# Patient Record
Sex: Female | Born: 1975 | Race: Black or African American | Hispanic: No | Marital: Single | Smoking: Never smoker
Health system: Southern US, Community
[De-identification: ages and names within clinical notes are randomized; demographics above are authoritative.]

## PROBLEM LIST (undated history)

## (undated) DIAGNOSIS — U071 COVID-19: Secondary | ICD-10-CM

## (undated) DIAGNOSIS — I10 Essential (primary) hypertension: Secondary | ICD-10-CM

## (undated) DIAGNOSIS — G43701 Chronic migraine without aura, not intractable, with status migrainosus: Secondary | ICD-10-CM

## (undated) DIAGNOSIS — J45909 Unspecified asthma, uncomplicated: Secondary | ICD-10-CM

## (undated) DIAGNOSIS — K219 Gastro-esophageal reflux disease without esophagitis: Secondary | ICD-10-CM

---

## 2000-11-12 ENCOUNTER — Encounter: Admission: RE | Admit: 2000-11-12 | Discharge: 2000-11-12 | Payer: Self-pay | Admitting: Family Medicine

## 2000-11-30 ENCOUNTER — Encounter: Admission: RE | Admit: 2000-11-30 | Discharge: 2000-11-30 | Payer: Self-pay | Admitting: Family Medicine

## 2000-12-01 ENCOUNTER — Other Ambulatory Visit: Admission: RE | Admit: 2000-12-01 | Discharge: 2000-12-01 | Payer: Self-pay | Admitting: Obstetrics

## 2001-05-20 ENCOUNTER — Encounter: Admission: RE | Admit: 2001-05-20 | Discharge: 2001-05-20 | Payer: Self-pay | Admitting: Family Medicine

## 2001-06-15 ENCOUNTER — Encounter: Admission: RE | Admit: 2001-06-15 | Discharge: 2001-06-15 | Payer: Self-pay | Admitting: Family Medicine

## 2001-07-16 ENCOUNTER — Encounter: Admission: RE | Admit: 2001-07-16 | Discharge: 2001-07-16 | Payer: Self-pay | Admitting: Family Medicine

## 2001-07-23 ENCOUNTER — Ambulatory Visit (HOSPITAL_COMMUNITY): Admission: RE | Admit: 2001-07-23 | Discharge: 2001-07-23 | Payer: Self-pay | Admitting: Sports Medicine

## 2001-07-23 ENCOUNTER — Encounter: Admission: RE | Admit: 2001-07-23 | Discharge: 2001-07-23 | Payer: Self-pay | Admitting: Family Medicine

## 2001-07-23 ENCOUNTER — Ambulatory Visit (HOSPITAL_COMMUNITY): Admission: RE | Admit: 2001-07-23 | Discharge: 2001-07-23 | Payer: Self-pay | Admitting: Family Medicine

## 2001-08-11 ENCOUNTER — Encounter: Admission: RE | Admit: 2001-08-11 | Discharge: 2001-08-11 | Payer: Self-pay | Admitting: Family Medicine

## 2001-09-02 ENCOUNTER — Encounter: Admission: RE | Admit: 2001-09-02 | Discharge: 2001-09-02 | Payer: Self-pay | Admitting: Family Medicine

## 2001-09-03 ENCOUNTER — Encounter: Admission: RE | Admit: 2001-09-03 | Discharge: 2001-09-03 | Payer: Self-pay | Admitting: Family Medicine

## 2001-09-16 ENCOUNTER — Encounter: Admission: RE | Admit: 2001-09-16 | Discharge: 2001-09-16 | Payer: Self-pay | Admitting: Family Medicine

## 2001-10-05 ENCOUNTER — Encounter: Admission: RE | Admit: 2001-10-05 | Discharge: 2001-10-05 | Payer: Self-pay | Admitting: Family Medicine

## 2002-01-14 ENCOUNTER — Encounter: Admission: RE | Admit: 2002-01-14 | Discharge: 2002-01-14 | Payer: Self-pay | Admitting: Family Medicine

## 2002-02-04 ENCOUNTER — Encounter: Admission: RE | Admit: 2002-02-04 | Discharge: 2002-02-04 | Payer: Self-pay | Admitting: Family Medicine

## 2002-03-25 ENCOUNTER — Encounter: Admission: RE | Admit: 2002-03-25 | Discharge: 2002-03-25 | Payer: Self-pay | Admitting: Family Medicine

## 2002-05-02 ENCOUNTER — Encounter: Admission: RE | Admit: 2002-05-02 | Discharge: 2002-05-02 | Payer: Self-pay | Admitting: Family Medicine

## 2002-07-28 ENCOUNTER — Encounter: Admission: RE | Admit: 2002-07-28 | Discharge: 2002-07-28 | Payer: Self-pay | Admitting: Family Medicine

## 2002-09-22 ENCOUNTER — Encounter: Admission: RE | Admit: 2002-09-22 | Discharge: 2002-09-22 | Payer: Self-pay | Admitting: Family Medicine

## 2002-09-29 ENCOUNTER — Encounter: Admission: RE | Admit: 2002-09-29 | Discharge: 2002-09-29 | Payer: Self-pay | Admitting: Family Medicine

## 2002-10-25 ENCOUNTER — Encounter: Admission: RE | Admit: 2002-10-25 | Discharge: 2002-10-25 | Payer: Self-pay | Admitting: Family Medicine

## 2003-01-23 ENCOUNTER — Encounter: Admission: RE | Admit: 2003-01-23 | Discharge: 2003-01-23 | Payer: Self-pay | Admitting: Family Medicine

## 2003-03-03 ENCOUNTER — Encounter: Payer: Self-pay | Admitting: Internal Medicine

## 2003-03-03 ENCOUNTER — Ambulatory Visit (HOSPITAL_COMMUNITY): Admission: RE | Admit: 2003-03-03 | Discharge: 2003-03-03 | Payer: Self-pay | Admitting: Internal Medicine

## 2005-11-18 ENCOUNTER — Encounter: Admission: RE | Admit: 2005-11-18 | Discharge: 2005-11-18 | Payer: Self-pay | Admitting: Internal Medicine

## 2007-01-19 ENCOUNTER — Emergency Department (HOSPITAL_COMMUNITY): Admission: EM | Admit: 2007-01-19 | Discharge: 2007-01-19 | Payer: Self-pay | Admitting: Family Medicine

## 2007-01-26 ENCOUNTER — Encounter: Admission: RE | Admit: 2007-01-26 | Discharge: 2007-01-26 | Payer: Self-pay | Admitting: Internal Medicine

## 2008-07-24 ENCOUNTER — Emergency Department (HOSPITAL_COMMUNITY): Admission: EM | Admit: 2008-07-24 | Discharge: 2008-07-24 | Payer: Self-pay | Admitting: Emergency Medicine

## 2009-03-08 ENCOUNTER — Emergency Department (HOSPITAL_COMMUNITY): Admission: EM | Admit: 2009-03-08 | Discharge: 2009-03-09 | Payer: Self-pay | Admitting: Emergency Medicine

## 2010-08-16 ENCOUNTER — Emergency Department (HOSPITAL_COMMUNITY): Admission: EM | Admit: 2010-08-16 | Discharge: 2010-08-16 | Payer: Self-pay | Admitting: Emergency Medicine

## 2010-11-15 ENCOUNTER — Ambulatory Visit: Payer: Self-pay | Admitting: Pulmonary Disease

## 2010-11-15 ENCOUNTER — Encounter: Payer: Self-pay | Admitting: Pulmonary Disease

## 2010-11-15 DIAGNOSIS — J309 Allergic rhinitis, unspecified: Secondary | ICD-10-CM | POA: Insufficient documentation

## 2010-11-15 DIAGNOSIS — J45909 Unspecified asthma, uncomplicated: Secondary | ICD-10-CM | POA: Insufficient documentation

## 2011-01-16 NOTE — Assessment & Plan Note (Signed)
Summary: COUGH/WHITE DROPLETS-OFF/ON-KP   Visit Type:  Initial Consult Copy to:  self Primary Provider/Referring Provider:  Andi Devon, MD  CC:  Cough.  History of Present Illness: 34/F for evaluation of asthma  c/o expectoration of foul smelling particles x 11 mnths, like 'cooked rice',  mar'10 cxr rul 2.5 cm  nodular opacity, but CXR  Asthma x high school, gained 100 lbs since, last ER visit mar'10, UC visits  -once this yr, triggers -URIs, stress, skin allergies to dog - gave it away, never tested formally symbicort has worked well -ventolin -once/wk or less, lasts a year winded climbing stairs, walking in the parking lot,  Spirometry nml.  Preventive Screening-Counseling & Management  Alcohol-Tobacco     Smoking Status: quit     Year Started: 1994     Year Quit: 2000  Current Medications (verified): 1)  Ventolin Hfa 108 (90 Base) Mcg/act Aers (Albuterol Sulfate) .... As Needed 2)  Albuterol Sulfate (2.5 Mg/76ml) 0.083% Nebu (Albuterol Sulfate) .... As Needed 3)  Symbicort 80-4.5 Mcg/act Aero (Budesonide-Formoterol Fumarate) .... Inhale 1 Puff Two Times A Day 4)  Xanax 0.25 Mg Tabs (Alprazolam) .... Take 1 Tab By Mouth At Bedtime As Needed For Sleep 5)  Benadryl 25 Mg Caps (Diphenhydramine Hcl) .... As Needed 6)  Zyrtec Hives Relief 10 Mg Tabs (Cetirizine Hcl) .... Take 1 Tablet By Mouth Once A Day 7)  Zantac 75 75 Mg Tabs (Ranitidine Hcl) .... Take 1 Tablet By Mouth Once A Day 8)  Cleocin-T 1 % Lotn (Clindamycin Phosphate) .... Two Times A Day 9)  Hydroquinone 4 % Crea (Hydroquinone) .... Two Times A Day 10)  Triamcinolone Acetonide 0.1 % Crea (Triamcinolone Acetonide) .... Nightly 11)  Ted Hose  Allergies (verified): 1)  ! * Birth Control 2)  ! Codeine 3)  ! Percocet 4)  ! Darvocet 5)  ! Vicodin 6)  ! Hydrocodone 7)  ! Ultram 8)  ! Flexeril 9)  ! Effexor  Past History:  Past Medical History: Angina Allergic Rhinitis Asthma Headaches  Past Surgical  History: none  Family History: Reviewed history and no changes required. Family History Asthma-mother Heart disease-paternal grandfather Colon Cancer- Family History Breast Cancer Cervical cancer Family History Lung Cancer-maternal great-grandfather  Social History: Reviewed history and no changes required. Marital Status: Single Children: no Occupation:RN  Patient states former smoker.  Smoking Status:  quit  Review of Systems       The patient complains of shortness of breath with activity, productive cough, non-productive cough, chest pain, irregular heartbeats, acid heartburn, indigestion, difficulty swallowing, sore throat, tooth/dental problems, headaches, nasal congestion/difficulty breathing through nose, sneezing, itching, hand/feet swelling, joint stiffness or pain, and change in color of mucus.  The patient denies shortness of breath at rest, coughing up blood, loss of appetite, weight change, abdominal pain, ear ache, anxiety, depression, rash, and fever.    Vital Signs:  Patient profile:   35 year old female Height:      63.5 inches Weight:      220.8 pounds BMI:     38.64 O2 Sat:      100 % on Room air Temp:     98.2 degrees F oral Pulse rate:   80 / minute BP sitting:   120 / 80  (left arm) Cuff size:   large  Vitals Entered By: Zackery Barefoot CMA (November 15, 2010 2:11 PM)  O2 Flow:  Room air CC: Cough Comments Medications reviewed with patient Verified contact number and pharmacy  with patient Zackery Barefoot CMA  November 15, 2010 2:12 PM    Physical Exam  Additional Exam:  Gen. Pleasant, well-nourished, in no distress, normal affect ENT - no lesions, no post nasal drip Neck: No JVD, no thyromegaly, no carotid bruits Lungs: no use of accessory muscles, no dullness to percussion, clear without rales or rhonchi  Cardiovascular: Rhythm regular, heart sounds  normal, no murmurs or gallops, no peripheral edema Abdomen: soft and non-tender, no  hepatosplenomegaly, BS normal. Musculoskeletal: No deformities, no cyanosis or clubbing Neuro:  alert, non focal     CXR  Procedure date:  11/15/2010  Findings:      IMPRESSION: No active cardiopulmonary disease.  Impression & Recommendations:  Problem # 1:  ASTHMA (ICD-493.90) Appears mild persistent by history - stay on symbicort Unclear what is the cause of her present symptoms (expectorating fine particles , even when not having an asthma attack) RUL opacity noted in march '10 has resolved, likley atelectasis.  Medications Added to Medication List This Visit: 1)  Ventolin Hfa 108 (90 Base) Mcg/act Aers (Albuterol sulfate) .... As needed 2)  Albuterol Sulfate (2.5 Mg/5ml) 0.083% Nebu (Albuterol sulfate) .... As needed 3)  Symbicort 80-4.5 Mcg/act Aero (Budesonide-formoterol fumarate) .... Inhale 1 puff two times a day 4)  Xanax 0.25 Mg Tabs (Alprazolam) .... Take 1 tab by mouth at bedtime as needed for sleep 5)  Benadryl 25 Mg Caps (Diphenhydramine hcl) .... As needed 6)  Zyrtec Hives Relief 10 Mg Tabs (Cetirizine hcl) .... Take 1 tablet by mouth once a day 7)  Zantac 75 75 Mg Tabs (Ranitidine hcl) .... Take 1 tablet by mouth once a day 8)  Cleocin-t 1 % Lotn (Clindamycin phosphate) .... Two times a day 9)  Hydroquinone 4 % Crea (Hydroquinone) .... Two times a day 10)  Triamcinolone Acetonide 0.1 % Crea (Triamcinolone acetonide) .... Nightly 11)  Ted Hose   Other Orders: T-2 View CXR (71020TC) Consultation Level III (16109) Spirometry w/Graph (60454)  Patient Instructions: 1)  Copy sent to: Kim shelton 2)  Breathing test - no obstruction 3)  A chest x-ray is normal  4)  Stay on symbicort  5)  Call if worse

## 2011-03-27 LAB — POCT PREGNANCY, URINE: Preg Test, Ur: NEGATIVE

## 2011-06-12 ENCOUNTER — Other Ambulatory Visit: Payer: Self-pay | Admitting: Internal Medicine

## 2011-06-12 DIAGNOSIS — N644 Mastodynia: Secondary | ICD-10-CM

## 2011-06-20 ENCOUNTER — Ambulatory Visit
Admission: RE | Admit: 2011-06-20 | Discharge: 2011-06-20 | Disposition: A | Discharge status: Home / Self Care | Payer: BC Managed Care – PPO | Source: Ambulatory Visit | Attending: Internal Medicine | Admitting: Internal Medicine

## 2011-06-20 DIAGNOSIS — N644 Mastodynia: Secondary | ICD-10-CM

## 2011-09-12 LAB — URINALYSIS, ROUTINE W REFLEX MICROSCOPIC
Bilirubin Urine: NEGATIVE
Glucose, UA: NEGATIVE
Hgb urine dipstick: NEGATIVE
Ketones, ur: NEGATIVE
Nitrite: NEGATIVE
Protein, ur: NEGATIVE
Specific Gravity, Urine: 1.015
Urobilinogen, UA: 0.2
pH: 6

## 2011-09-12 LAB — POCT CARDIAC MARKERS
CKMB, poc: <1 — ABNORMAL LOW
Myoglobin, poc: 92.8
Troponin i, poc: 0.06 — ABNORMAL HIGH

## 2011-09-12 LAB — CBC
HCT: 39.2
Hemoglobin: 13.4
MCHC: 34
MCV: 80.2
Platelets: 214
RBC: 4.89
RDW: 12.8
WBC: 5.8

## 2011-09-12 LAB — URINE MICROSCOPIC-ADD ON

## 2011-09-12 LAB — CK TOTAL AND CKMB (NOT AT ARMC)
Relative Index: INVALID
Total CK: 93

## 2011-09-12 LAB — DIFFERENTIAL
Basophils Absolute: 0
Basophils Relative: 0
Eosinophils Absolute: 0
Eosinophils Relative: 1
Lymphocytes Relative: 36
Lymphs Abs: 2.1
Monocytes Absolute: 0.2
Monocytes Relative: 3
Neutro Abs: 3.5
Neutrophils Relative %: 60

## 2011-09-12 LAB — POCT PREGNANCY, URINE: Preg Test, Ur: NEGATIVE

## 2011-09-12 LAB — D-DIMER, QUANTITATIVE (NOT AT ARMC): D-Dimer, Quant: 0.39

## 2016-08-30 ENCOUNTER — Emergency Department (HOSPITAL_COMMUNITY)
Admission: EM | Admit: 2016-08-30 | Discharge: 2016-08-30 | Disposition: A | Discharge status: Home / Self Care | Payer: Managed Care, Other (non HMO) | Attending: Emergency Medicine | Admitting: Emergency Medicine

## 2016-08-30 ENCOUNTER — Encounter (HOSPITAL_COMMUNITY): Payer: Self-pay

## 2016-08-30 ENCOUNTER — Emergency Department (HOSPITAL_COMMUNITY): Payer: Managed Care, Other (non HMO)

## 2016-08-30 DIAGNOSIS — Y999 Unspecified external cause status: Secondary | ICD-10-CM | POA: Diagnosis not present

## 2016-08-30 DIAGNOSIS — S4992XA Unspecified injury of left shoulder and upper arm, initial encounter: Secondary | ICD-10-CM | POA: Diagnosis present

## 2016-08-30 DIAGNOSIS — S52042A Displaced fracture of coronoid process of left ulna, initial encounter for closed fracture: Secondary | ICD-10-CM

## 2016-08-30 DIAGNOSIS — W0110XA Fall on same level from slipping, tripping and stumbling with subsequent striking against unspecified object, initial encounter: Secondary | ICD-10-CM | POA: Diagnosis not present

## 2016-08-30 DIAGNOSIS — Y929 Unspecified place or not applicable: Secondary | ICD-10-CM | POA: Insufficient documentation

## 2016-08-30 DIAGNOSIS — Y939 Activity, unspecified: Secondary | ICD-10-CM | POA: Diagnosis not present

## 2016-08-30 DIAGNOSIS — S52122A Displaced fracture of head of left radius, initial encounter for closed fracture: Secondary | ICD-10-CM

## 2016-08-30 DIAGNOSIS — J45909 Unspecified asthma, uncomplicated: Secondary | ICD-10-CM | POA: Diagnosis not present

## 2016-08-30 DIAGNOSIS — S52045A Nondisplaced fracture of coronoid process of left ulna, initial encounter for closed fracture: Secondary | ICD-10-CM | POA: Insufficient documentation

## 2016-08-30 HISTORY — DX: Unspecified asthma, uncomplicated: J45.909

## 2016-08-30 MED ORDER — IBUPROFEN 800 MG PO TABS
800.0000 mg | ORAL_TABLET | Freq: Once | ORAL | Status: AC
  Administered 2016-08-30: 800 mg via ORAL
  Filled 2016-08-30: qty 1

## 2016-08-30 NOTE — ED Notes (Signed)
Ortho Tech at the bedside ° °

## 2016-08-30 NOTE — Discharge Instructions (Signed)
You have fractured the bones in your elbow.  We will place an immobilizing device.  Elevate, ice, Tylenol or ibuprofen for pain. Follow-up with orthopedic doctor. Phone number given.

## 2016-08-30 NOTE — ED Notes (Signed)
Going to xray 

## 2016-08-30 NOTE — ED Provider Notes (Signed)
[facility]-EMERGENCY DEPT Provider Note   CSN: [id number] Arrival date & time: 08/30/16  1505     History   Chief Complaint Chief Complaint  Patient presents with  . Shoulder Injury    HPI Danielle May is a 40 y.o. female.  Accidental trip and fall earlier today striking left elbow.   No head or neck trauma. Patient was seen at the local urgent care center and referred to the ED. Patient was told that she had a dislocated shoulder, but her shoulder is not hurting. Severity of pain is moderate. Pain is worse with range of motion at the elbow      Past Medical History:  Diagnosis Date  . Asthma     Patient Active Problem List   Diagnosis Date Noted  . ALLERGIC RHINITIS 11/15/2010  . ASTHMA 11/15/2010    No past surgical history on file.  OB History    No data available       Home Medications    Prior to Admission medications   Medication Sig Start Date End Date Taking? Authorizing Provider  albuterol (PROVENTIL) (2.5 MG/3ML) 0.083% nebulizer solution Inhale 2.5 mg into the lungs every 6 (six) hours as needed for wheezing or shortness of breath.    Yes Historical Provider, MD  Albuterol Sulfate 108 (90 Base) MCG/ACT AEPB Inhale 2 puffs into the lungs every 6 (six) hours as needed (for wheezing or shortness of breath).    Yes Historical Provider, MD  Artificial Tear Solution (GENTEAL TEARS OP) Apply 1 drop to eye daily as needed (dry eye relief).   Yes Historical Provider, MD  cetirizine (ZYRTEC) 10 MG tablet Take 10 mg by mouth daily.   Yes Historical Provider, MD  lansoprazole (PREVACID) 30 MG capsule Take 30 mg by mouth daily at 12 noon.   Yes Historical Provider, MD  pantoprazole (PROTONIX) 40 MG tablet Take 40 mg by mouth daily.   Yes Historical Provider, MD    Family History No family history on file.  Social History Social History  Substance Use Topics  . Smoking status: Never Smoker  . Smokeless tobacco: Never Used  . Alcohol use No     Allergies    Cyclobenzaprine hcl; Ipratropium; Venlafaxine; Codeine; Hydrocodone; Hydrocodone-acetaminophen; Ortho tri-cyclen  [norgestimate-eth estradiol]; Oxycodone-acetaminophen; Propoxyphene n-acetaminophen; and Tramadol hcl   Review of Systems Review of Systems  All other systems reviewed and are negative.    Physical Exam Updated Vital Signs BP 148/100 (BP Location: Right Wrist)   Pulse 82   Temp 98 F (36.7 C) (Oral)   Resp 16   Ht 5\' 3"  (1.6 m)   Wt 254 lb (115.2 kg)   LMP 08/26/2016 (Exact Date)   SpO2 100%   BMI 44.99 kg/m   Physical Exam  Constitutional: She is oriented to person, place, and time. She appears well-developed and well-nourished.  HENT:  Head: Normocephalic and atraumatic.  Eyes: Conjunctivae are normal.  Neck: Neck supple.  Cardiovascular: Normal rate and regular rhythm.   Pulmonary/Chest: Effort normal and breath sounds normal.  Abdominal: Soft. Bowel sounds are normal.  Musculoskeletal:  Left upper extremity: Tender over elbow tip. Pain with flexion and extension and pain with internal and external rotation  Neurological: She is alert and oriented to person, place, and time.  Skin: Skin is warm and dry.  Psychiatric: She has a normal mood and affect. Her behavior is normal.  Nursing note and vitals reviewed.    ED Treatments / Results  Labs (all labs  ordered are listed, but only abnormal results are displayed) Labs Reviewed - No data to display  EKG  EKG Interpretation None       Radiology Dg Elbow Complete Left  Result Date: 08/30/2016 CLINICAL DATA:  Pain in LEFT elbow and forearm, fell today EXAM: LEFT ELBOW - COMPLETE 3+ VIEW COMPARISON:  None FINDINGS: Osseous mineralization normal. Joint spaces preserved. Minimally displaced intra-articular LEFT radial head fracture. Associated elbow joint effusion. Nondisplaced coronoid process fracture of ulna. No additional fracture, dislocation, or bone destruction. IMPRESSION: Mildly displaced  intra-articular LEFT radial head fracture. Nondisplaced coronoid process fracture LEFT ulna. Electronically Signed   By: Ulyses Southward M.D.   On: 08/30/2016 18:53   Dg Forearm Left  Result Date: 08/30/2016 CLINICAL DATA:  Fall EXAM: LEFT FOREARM - 2 VIEW COMPARISON:  LEFT elbow radiographs 08/30/2016 FINDINGS: Displaced intra-articular LEFT radial head fracture again seen. Wrist and elbow joint alignments normal. Ulnar coronoid process fracture seen on prior elbow radiographs is not definitely visualized. No additional fracture, dislocation, or bone destruction. IMPRESSION: Displaced intra-articular fracture LEFT radial head. No additional forearm abnormalities identified. Electronically Signed   By: Ulyses Southward M.D.   On: 08/30/2016 18:55    Procedures Procedures (including critical care time)  Medications Ordered in ED Medications  ibuprofen (ADVIL,MOTRIN) tablet 800 mg (800 mg Oral Given 08/30/16 2026)     Initial Impression / Assessment and Plan / ED Course  I have reviewed the triage vital signs and the nursing notes.  Pertinent labs & imaging results that were available during my care of the patient were reviewed by me and considered in my medical decision making (see chart for details).  Clinical Course    Patient has a radial head fracture extending into the joint space and a fracture of the coronoid process of the ulna, both minimally displaced. Discussed with orthopedic surgeon Dr. Janee Morn. Will splint the elbow, pain management, referral to orthopedics.  Final Clinical Impressions(s) / ED Diagnoses   Final diagnoses:  Radial head fracture, closed, left, initial encounter  Fracture of ulna, coronoid process, left, closed, initial encounter    New Prescriptions New Prescriptions   No medications on file     Donnetta Hutching, MD 08/30/16 2133

## 2016-08-30 NOTE — ED Notes (Signed)
PT CAME FROM URGENT CARE AFTER SHE FELL AT HOME INJURING HER LT ELBOW AND LOWER ARM   GOOD RADIAL; PULSE

## 2016-08-30 NOTE — ED Triage Notes (Signed)
Pt was sent here from UC. She reports she fell this morning and was told by the MD that she has shoulder dislocation. Pulses inatct, limited movement noted.

## 2016-08-30 NOTE — ED Notes (Signed)
NO LOC  ALERT ORIENTED

## 2016-08-30 NOTE — Progress Notes (Signed)
Orthopedic Tech Progress Note Patient Details:  Izola Priceameco L Kotch 07/20/1976 [id number]  Ortho Devices Type of Ortho Device: Ace wrap, Arm sling Ortho Device/Splint Location: LUE Ortho Device/Splint Interventions: Ordered, Application   Jennye MoccasinHughes, Feliciana Narayan Craig 08/30/2016, 9:56 PM

## 2016-08-30 NOTE — ED Notes (Signed)
Reports no relief after receiving ibuprofen.  Offered more pain meds.  States I can't take much due to allergies so I'm okay for now.  Encouraged to call for assistance as needed.

## 2018-06-19 ENCOUNTER — Emergency Department (HOSPITAL_COMMUNITY)
Admission: EM | Admit: 2018-06-19 | Discharge: 2018-06-19 | Disposition: A | Discharge status: Home / Self Care | Payer: BLUE CROSS/BLUE SHIELD | Attending: Emergency Medicine | Admitting: Emergency Medicine

## 2018-06-19 ENCOUNTER — Encounter (HOSPITAL_COMMUNITY): Payer: Self-pay | Admitting: Nurse Practitioner

## 2018-06-19 DIAGNOSIS — G43809 Other migraine, not intractable, without status migrainosus: Secondary | ICD-10-CM

## 2018-06-19 DIAGNOSIS — G43909 Migraine, unspecified, not intractable, without status migrainosus: Secondary | ICD-10-CM | POA: Insufficient documentation

## 2018-06-19 DIAGNOSIS — J45909 Unspecified asthma, uncomplicated: Secondary | ICD-10-CM | POA: Insufficient documentation

## 2018-06-19 DIAGNOSIS — Z79899 Other long term (current) drug therapy: Secondary | ICD-10-CM | POA: Insufficient documentation

## 2018-06-19 MED ORDER — LORAZEPAM 2 MG/ML IJ SOLN
0.5000 mg | Freq: Once | INTRAMUSCULAR | Status: AC
  Administered 2018-06-19: 0.5 mg via INTRAVENOUS
  Filled 2018-06-19: qty 1

## 2018-06-19 MED ORDER — DIPHENHYDRAMINE HCL 50 MG/ML IJ SOLN
12.5000 mg | Freq: Once | INTRAMUSCULAR | Status: AC
  Administered 2018-06-19: 12.5 mg via INTRAVENOUS
  Filled 2018-06-19: qty 1

## 2018-06-19 MED ORDER — SODIUM CHLORIDE 0.9 % IV SOLN: INTRAVENOUS | Status: DC

## 2018-06-19 MED ORDER — SODIUM CHLORIDE 0.9 % IV BOLUS
1000.0000 mL | Freq: Once | INTRAVENOUS | Status: AC
  Administered 2018-06-19: 1000 mL via INTRAVENOUS

## 2018-06-19 MED ORDER — METOCLOPRAMIDE HCL 5 MG/ML IJ SOLN
10.0000 mg | Freq: Once | INTRAMUSCULAR | Status: AC
  Administered 2018-06-19: 10 mg via INTRAVENOUS
  Filled 2018-06-19: qty 2

## 2018-06-19 MED ORDER — KETOROLAC TROMETHAMINE 30 MG/ML IJ SOLN
15.0000 mg | Freq: Once | INTRAMUSCULAR | Status: AC
  Administered 2018-06-19: 15 mg via INTRAVENOUS
  Filled 2018-06-19: qty 1

## 2018-06-19 NOTE — ED Provider Notes (Signed)
[facility]-EMERGENCY DEPT Provider Note   CSN: [id number] Arrival date & time: 06/19/18  1512     History   Chief Complaint Chief Complaint  Patient presents with  . Aphasia  . Migraine    HPI Danielle May is a 42 y.o. female.  42 year old female with history of complex migraine presents with 12-hour history of dysarthria.  Seen for similar symptoms 3 weeks ago and had a negative work-up and was diagnosed with complex migraines.  States that her headache starts in her forehead and goes to the back of her neck.  Is associated with photophobia but not phonophobia.  No visual changes or focal weakness in upper or lower extremities.  Denies any ataxia when walking.  Has used NSAIDs without relief.  Denies any emesis at this time but has had nausea.  No fever or chills.     Past Medical History:  Diagnosis Date  . Asthma     Patient Active Problem List   Diagnosis Date Noted  . ALLERGIC RHINITIS 11/15/2010  . ASTHMA 11/15/2010    History reviewed. No pertinent surgical history.   OB History   None      Home Medications    Prior to Admission medications   Medication Sig Start Date End Date Taking? Authorizing Provider  albuterol (PROVENTIL) (2.5 MG/3ML) 0.083% nebulizer solution Inhale 2.5 mg into the lungs every 6 (six) hours as needed for wheezing or shortness of breath.    Yes [provider]  Albuterol Sulfate 108 (90 Base) MCG/ACT AEPB Inhale 2 puffs into the lungs every 6 (six) hours as needed (for wheezing or shortness of breath).    Yes [provider]  cetirizine (ZYRTEC) 10 MG tablet Take 10 mg by mouth daily.   Yes [provider]  cholecalciferol (VITAMIN D) 1000 units tablet Take 1,000 Units by mouth daily.   Yes [provider]  fluticasone (FLONASE) 50 MCG/ACT nasal spray Place 1 spray into both nostrils daily.   Yes [provider]  Fluticasone-Salmeterol (ADVAIR) 250-50 MCG/DOSE AEPB  Inhale 1 puff into the lungs 2 (two) times daily.   Yes [provider]  montelukast (SINGULAIR) 10 MG tablet Take 10 mg by mouth at bedtime.   Yes [provider]  ranitidine (ZANTAC) 150 MG tablet Take 150 mg by mouth 2 (two) times daily as needed for heartburn.   Yes [provider]  vitamin C (ASCORBIC ACID) 500 MG tablet Take 500 mg by mouth daily.   Yes [provider]  Artificial Tear Solution (GENTEAL TEARS OP) Apply 1 drop to eye daily as needed (dry eye relief).    [provider]    Family History No family history on file.  Social History Social History   Tobacco Use  . Smoking status: Never Smoker  . Smokeless tobacco: Never Used  Substance Use Topics  . Alcohol use: No  . Drug use: No     Allergies   Cyclobenzaprine hcl; Ipratropium; Venlafaxine; Codeine; Hydrocodone; Hydrocodone-acetaminophen; Ortho tri-cyclen  [norgestimate-eth estradiol]; Oxycodone-acetaminophen; Propoxyphene n-acetaminophen; and Tramadol hcl   Review of Systems Review of Systems  All other systems reviewed and are negative.    Physical Exam Updated Vital Signs BP 133/90 (BP Location: Right Arm)   Pulse 88   Temp 98.1 F (36.7 C) (Oral)   Resp 14   SpO2 98%   Physical Exam  Constitutional: She is oriented to person, place, and time. She appears well-developed and well-nourished.  Non-toxic  appearance. No distress.  HENT:  Head: Normocephalic and atraumatic.  Eyes: Pupils are equal, round, and reactive to light. Conjunctivae, EOM and lids are normal.  Neck: Normal range of motion. Neck supple. No tracheal deviation present. No thyroid mass present.  Cardiovascular: Normal rate, regular rhythm and normal heart sounds. Exam reveals no gallop.  No murmur heard. Pulmonary/Chest: Effort normal and breath sounds normal. No stridor. No respiratory distress. She has no decreased breath sounds. She has no wheezes. She has no rhonchi. She has no  rales.  Abdominal: Soft. Normal appearance and bowel sounds are normal. She exhibits no distension. There is no tenderness. There is no rebound and no CVA tenderness.  Musculoskeletal: Normal range of motion. She exhibits no edema or tenderness.  Neurological: She is alert and oriented to person, place, and time. She has normal strength. No cranial nerve deficit or sensory deficit. She displays a negative Romberg sign. GCS eye subscore is 4. GCS verbal subscore is 5. GCS motor subscore is 6.  Dysarthria noted. Strength 5/5 in upper and lower extremities.  No facial asymmetry noted.  No pronator drift.  Skin: Skin is warm and dry. No abrasion and no rash noted.  Psychiatric: She has a normal mood and affect. Her speech is normal and behavior is normal.  Nursing note and vitals reviewed.    ED Treatments / Results  Labs (all labs ordered are listed, but only abnormal results are displayed) Labs Reviewed - No data to display  EKG None  Radiology No results found.  Procedures Procedures (including critical care time)  Medications Ordered in ED Medications  sodium chloride 0.9 % bolus 1,000 mL (has no administration in time range)  0.9 %  sodium chloride infusion (has no administration in time range)  metoCLOPramide (REGLAN) injection 10 mg (has no administration in time range)  diphenhydrAMINE (BENADRYL) injection 12.5 mg (has no administration in time range)  LORazepam (ATIVAN) injection 0.5 mg (has no administration in time range)     Initial Impression / Assessment and Plan / ED Course  I have reviewed the triage vital signs and the nursing notes.  Pertinent labs & imaging results that were available during my care of the patient were reviewed by me and considered in my medical decision making (see chart for details).     Patient with established history of complex migraine.  Migraine headache was treated here and her speech gradually improved to the point at time of  discharge was back to normal.  States that her headache has also greatly improved as well 2.  Do not think that this represented a TIA or CVA.  Will encourage her to follow-up with neurology  Final Clinical Impressions(s) / ED Diagnoses   Final diagnoses:  None    ED Discharge Orders    None       Lorre Nick, MD 06/19/18 1914

## 2018-06-19 NOTE — ED Triage Notes (Signed)
Pt presents with somewhat significant aphasia that she reports gradual onset of a about 3- 6 hours ago. She reports 3 weeks, she experienced similar symptoms that had code stroke work up that included serial CTs and MRIs of the head that were all negative of stroke. States her symptoms self resolved and when she followed up with neurology she was diagnosed with a form of complex migraine. She is c/o severe HA time, FAST quick stroke screen only positive for Aphasia/slurred Speech.

## 2019-10-10 ENCOUNTER — Other Ambulatory Visit: Payer: Self-pay

## 2019-10-10 ENCOUNTER — Encounter (HOSPITAL_COMMUNITY): Payer: Self-pay

## 2019-10-10 ENCOUNTER — Ambulatory Visit (HOSPITAL_COMMUNITY)
Admission: EM | Admit: 2019-10-10 | Discharge: 2019-10-10 | Disposition: A | Discharge status: Home / Self Care | Payer: BC Managed Care – PPO | Attending: Family Medicine | Admitting: Family Medicine

## 2019-10-10 DIAGNOSIS — I10 Essential (primary) hypertension: Secondary | ICD-10-CM

## 2019-10-10 DIAGNOSIS — G43911 Migraine, unspecified, intractable, with status migrainosus: Secondary | ICD-10-CM | POA: Diagnosis not present

## 2019-10-10 HISTORY — DX: Gastro-esophageal reflux disease without esophagitis: K21.9

## 2019-10-10 HISTORY — DX: Chronic migraine without aura, not intractable, with status migrainosus: G43.701

## 2019-10-10 HISTORY — DX: Essential (primary) hypertension: I10

## 2019-10-10 MED ORDER — KETOROLAC TROMETHAMINE 60 MG/2ML IM SOLN
INTRAMUSCULAR | Status: AC
  Filled 2019-10-10: qty 2

## 2019-10-10 MED ORDER — METOCLOPRAMIDE HCL 5 MG/ML IJ SOLN
5.0000 mg | Freq: Once | INTRAMUSCULAR | Status: AC
  Administered 2019-10-10: 09:00:00 5 mg via INTRAMUSCULAR

## 2019-10-10 MED ORDER — KETOROLAC TROMETHAMINE 60 MG/2ML IM SOLN
60.0000 mg | Freq: Once | INTRAMUSCULAR | Status: AC
  Administered 2019-10-10: 09:00:00 60 mg via INTRAMUSCULAR

## 2019-10-10 MED ORDER — METOCLOPRAMIDE HCL 5 MG/ML IJ SOLN
INTRAMUSCULAR | Status: AC
  Filled 2019-10-10: qty 2

## 2019-10-10 MED ORDER — DEXAMETHASONE SODIUM PHOSPHATE 10 MG/ML IJ SOLN
INTRAMUSCULAR | Status: AC
  Filled 2019-10-10: qty 1

## 2019-10-10 MED ORDER — DEXAMETHASONE SODIUM PHOSPHATE 10 MG/ML IJ SOLN
10.0000 mg | Freq: Once | INTRAMUSCULAR | Status: AC
  Administered 2019-10-10: 09:00:00 10 mg via INTRAMUSCULAR

## 2019-10-10 NOTE — ED Provider Notes (Signed)
[facility]    CSN: [id number] Arrival date & time: 10/10/19  [id number]      History   Chief Complaint Chief Complaint  Patient presents with  . Migraine    HPI Danielle May is a 43 y.o. female.   Patient is a 43 year old female with past medical history of migraine, allergic rhinitis, asthma, essential hypertension.  She presents today with approximate 4 days of generalized frontal headache radiating into the occipital region.  Describes as throbbing.  This is been intermittent.  She is also been having some elevated blood pressure readings with the highest being 170/100.  Takes Coreg twice a day for blood pressure.  She has been previously prescribed muscle relaxer and Reglan for her migraines.  This is not helped with her symptoms.  Mild dizziness.  No blurry vision.  Took one of her pain pills from her knee surgery and this helps with the pain.  She has previously seen a neurologist for this.  She has had complex migraines in the past and was hospitalized.      Past Medical History:  Diagnosis Date  . Asthma   . Chronic migraine w/o aura, not intractable, w stat migr   . GERD (gastroesophageal reflux disease)   . Hypertension     Patient Active Problem List   Diagnosis Date Noted  . ALLERGIC RHINITIS 11/15/2010  . ASTHMA 11/15/2010    History reviewed. No pertinent surgical history.  OB History   No obstetric history on file.      Home Medications    Prior to Admission medications   Medication Sig Start Date End Date Taking? Authorizing Provider  albuterol (PROVENTIL) (2.5 MG/3ML) 0.083% nebulizer solution Inhale 2.5 mg into the lungs every 6 (six) hours as needed for wheezing or shortness of breath.     [provider]  Albuterol Sulfate 108 (90 Base) MCG/ACT AEPB Inhale 2 puffs into the lungs every 6 (six) hours as needed (for wheezing or shortness of breath).     [provider]  Artificial Tear Solution (GENTEAL TEARS OP) Apply  1 drop to eye daily as needed (dry eye relief).    [provider]  cetirizine (ZYRTEC) 10 MG tablet Take 10 mg by mouth daily.    [provider]  cholecalciferol (VITAMIN D) 1000 units tablet Take 1,000 Units by mouth daily.    [provider]  fluticasone (FLONASE) 50 MCG/ACT nasal spray Place 1 spray into both nostrils daily.    [provider]  Fluticasone-Salmeterol (ADVAIR) 250-50 MCG/DOSE AEPB Inhale 1 puff into the lungs 2 (two) times daily.    [provider]  montelukast (SINGULAIR) 10 MG tablet Take 10 mg by mouth at bedtime.    [provider]  ranitidine (ZANTAC) 150 MG tablet Take 150 mg by mouth 2 (two) times daily as needed for heartburn.    [provider]  vitamin C (ASCORBIC ACID) 500 MG tablet Take 500 mg by mouth daily.    [provider]    Family History No family history on file.  Social History Social History   Tobacco Use  . Smoking status: Never Smoker  . Smokeless tobacco: Never Used  Substance Use Topics  . Alcohol use: No  . Drug use: No     Allergies   Cyclobenzaprine hcl, Ipratropium, Venlafaxine, Codeine, Hydrocodone, Hydrocodone-acetaminophen, Ortho tri-cyclen  [norgestimate-eth estradiol], Oxycodone-acetaminophen, Propoxyphene n-acetaminophen, and Tramadol hcl   Review of Systems Review of Systems   Physical Exam  Triage Vital Signs ED Triage Vitals  Enc Vitals Group     BP      Pulse      Resp      Temp      Temp src      SpO2      Weight      Height      Head Circumference      Peak Flow      Pain Score      Pain Loc      Pain Edu?      Excl. in GC?    No data found.  Updated Vital Signs BP (!) 153/101 (BP Location: Left Wrist)   Pulse 72   Temp 98.5 F (36.9 C) (Oral)   Resp 20   Wt 253 lb (114.8 kg)   LMP 10/10/2019   SpO2 100%   BMI 44.82 kg/m   Visual Acuity Right Eye Distance:   Left Eye Distance:   Bilateral Distance:    Right Eye  Near:   Left Eye Near:    Bilateral Near:     Physical Exam Vitals signs and nursing note reviewed.  Constitutional:      General: She is not in acute distress.    Appearance: Normal appearance. She is not ill-appearing, toxic-appearing or diaphoretic.  HENT:     Head: Normocephalic and atraumatic.     Nose: Nose normal.     Mouth/Throat:     Pharynx: Oropharynx is clear.  Eyes:     Extraocular Movements: Extraocular movements intact.     Conjunctiva/sclera: Conjunctivae normal.     Pupils: Pupils are equal, round, and reactive to light.  Neck:     Musculoskeletal: Normal range of motion.  Cardiovascular:     Rate and Rhythm: Normal rate and regular rhythm.     Heart sounds: Normal heart sounds.  Pulmonary:     Effort: Pulmonary effort is normal.     Breath sounds: Normal breath sounds.  Musculoskeletal: Normal range of motion.  Skin:    General: Skin is warm and dry.  Neurological:     General: No focal deficit present.     Mental Status: She is alert.     Cranial Nerves: No cranial nerve deficit.     Sensory: No sensory deficit.     Motor: No weakness.     Gait: Gait normal.  Psychiatric:        Mood and Affect: Mood normal.      UC Treatments / Results  Labs (all labs ordered are listed, but only abnormal results are displayed) Labs Reviewed - No data to display  EKG   Radiology No results found.  Procedures Procedures (including critical care time)  Medications Ordered in UC Medications  ketorolac (TORADOL) injection 60 mg (60 mg Intramuscular Given 10/10/19 0906)  metoCLOPramide (REGLAN) injection 5 mg (5 mg Intramuscular Given 10/10/19 0906)  dexamethasone (DECADRON) injection 10 mg (10 mg Intramuscular Given 10/10/19 0906)  dexamethasone (DECADRON) 10 MG/ML injection (has no administration in time range)  ketorolac (TORADOL) 60 MG/2ML injection (has no administration in time range)  metoCLOPramide (REGLAN) 5 MG/ML injection (has no administration  in time range)    Initial Impression / Assessment and Plan / UC Course  I have reviewed the triage vital signs and the nursing notes.  Pertinent labs & imaging results that were available during my care of the patient were reviewed by me and considered in my medical decision making (see chart for  details).     Migraine- treating with migraine cocktail No concern for CVA or hypertensive crisis.  BP 143/97 and Neuro intact.  Patient feeling slightly better after migraine cocktail. Blood pressure still elevated but she did not take her blood pressure medication this morning. Recommended go home rest, take blood pressure medication And monitor symptoms.  A few  contacts put on discharge instructions for primary care follow-up here in [place]. Warning signs and red flags discussed with patient and recommended if these occur she will need to go straight to the ER. Patient understanding and agree to plan. Final Clinical Impressions(s) / UC Diagnoses   Final diagnoses:  Intractable migraine with status migrainosus, unspecified migraine type     Discharge Instructions     Migraine cocktail given here today.  Follow up with PCP and neurology.  A few contacts put on your discharge instruction.  For continuous or worse symptoms you will need to go to the ER.     ED Prescriptions    None     I have reviewed the PDMP during this encounter.   Orvan July, NP 10/10/19 [phone]

## 2019-10-10 NOTE — ED Triage Notes (Signed)
Pt states she has a out control B/P and she has a migraine.x 4 days.

## 2019-10-10 NOTE — Discharge Instructions (Addendum)
Migraine cocktail given here today.  Follow up with PCP and neurology.  A few contacts put on your discharge instruction.  For continuous or worse symptoms you will need to go to the ER.

## 2020-07-12 ENCOUNTER — Ambulatory Visit: Payer: PRIVATE HEALTH INSURANCE | Attending: Internal Medicine

## 2020-07-12 DIAGNOSIS — Z23 Encounter for immunization: Secondary | ICD-10-CM

## 2020-07-12 NOTE — Progress Notes (Signed)
   Covid-19 Vaccination Clinic  Name:  Danielle May    MRN: [id number] DOB: 16-Nov-1976  07/12/2020  [name] was observed post Covid-19 immunization for 30 minutes based on pre-vaccination screening. Pt observed due to slowed speech, evaluated by EMS, transport declined. Condition improved and pt was transported by friend via personal vehicle. Will follow up with PCP. She was provided with Vaccine Information Sheet and instruction to access the V-Safe system.   [name] was instructed to call 911 with any severe reactions post vaccine: Marland Kitchen Difficulty breathing  . Swelling of face and throat  . A fast heartbeat  . A bad rash all over body  . Dizziness and weakness   Immunizations Administered    Name Date Dose VIS Date Route   Pfizer COVID-19 Vaccine 07/12/2020  4:40 PM 0.3 mL 02/08/2019 Intramuscular   Manufacturer: [employer], [employer]   Lot: [id number]   NDC: [address]-5

## 2020-08-07 ENCOUNTER — Ambulatory Visit: Payer: Self-pay

## 2020-09-17 ENCOUNTER — Encounter (HOSPITAL_COMMUNITY): Payer: Self-pay

## 2020-09-17 NOTE — Progress Notes (Signed)
Additional information noted by Lavella Hammock, RN:  Pt became weak and noted slow speech 10 minutes after receiving vaccination. Remained appropriate and responsive, took a few sips of water. Initial BP 141/86 HR 104 transferred w assist to stretcher. Vitals at 5pm BP 143/92 HR 97. Pt reports having similar but less severe rx to influenza vaccine. Remained A+Ox4. 5:05p EMS arrived to evaluate. Pt called a friend to transport and refused EMS transport. Transported via wheelchair to personal vehicle of friend. Transported to vehicle w minimal assist. Speech improved by 5:35p but not fully recovered. Will follow up with PCP.

## 2021-09-23 ENCOUNTER — Other Ambulatory Visit: Payer: Self-pay

## 2021-09-23 ENCOUNTER — Encounter (HOSPITAL_BASED_OUTPATIENT_CLINIC_OR_DEPARTMENT_OTHER): Payer: Self-pay | Admitting: Obstetrics and Gynecology

## 2021-09-23 ENCOUNTER — Emergency Department (HOSPITAL_BASED_OUTPATIENT_CLINIC_OR_DEPARTMENT_OTHER)
Admission: EM | Admit: 2021-09-23 | Discharge: 2021-09-23 | Disposition: A | Discharge status: Home / Self Care | Payer: PRIVATE HEALTH INSURANCE | Attending: Emergency Medicine | Admitting: Emergency Medicine

## 2021-09-23 ENCOUNTER — Emergency Department (HOSPITAL_BASED_OUTPATIENT_CLINIC_OR_DEPARTMENT_OTHER): Payer: PRIVATE HEALTH INSURANCE

## 2021-09-23 DIAGNOSIS — R110 Nausea: Secondary | ICD-10-CM | POA: Diagnosis not present

## 2021-09-23 DIAGNOSIS — J4521 Mild intermittent asthma with (acute) exacerbation: Secondary | ICD-10-CM

## 2021-09-23 DIAGNOSIS — Z7951 Long term (current) use of inhaled steroids: Secondary | ICD-10-CM | POA: Diagnosis not present

## 2021-09-23 DIAGNOSIS — I10 Essential (primary) hypertension: Secondary | ICD-10-CM | POA: Diagnosis not present

## 2021-09-23 DIAGNOSIS — J45909 Unspecified asthma, uncomplicated: Secondary | ICD-10-CM | POA: Insufficient documentation

## 2021-09-23 DIAGNOSIS — U071 COVID-19: Secondary | ICD-10-CM | POA: Diagnosis present

## 2021-09-23 HISTORY — DX: COVID-19: U07.1

## 2021-09-23 MED ORDER — ONDANSETRON 4 MG PO TBDP
8.0000 mg | ORAL_TABLET | Freq: Once | ORAL | Status: DC
  Filled 2021-09-23: qty 2

## 2021-09-23 MED ORDER — ALBUTEROL SULFATE HFA 108 (90 BASE) MCG/ACT IN AERS
2.0000 | INHALATION_SPRAY | RESPIRATORY_TRACT | Status: DC | PRN
  Administered 2021-09-23: 2 via RESPIRATORY_TRACT
  Filled 2021-09-23: qty 6.7

## 2021-09-23 MED ORDER — PREDNISONE 20 MG PO TABS: 60.0000 mg | ORAL_TABLET | Freq: Every day | ORAL | 0 refills | Status: AC

## 2021-09-23 MED ORDER — ALBUTEROL SULFATE HFA 108 (90 BASE) MCG/ACT IN AERS: 2.0000 | INHALATION_SPRAY | RESPIRATORY_TRACT | 1 refills | Status: AC | PRN

## 2021-09-23 MED ORDER — METOCLOPRAMIDE HCL 10 MG PO TABS
10.0000 mg | ORAL_TABLET | Freq: Once | ORAL | Status: AC
Start: 2021-09-23 — End: 2021-09-23
  Administered 2021-09-23: 10 mg via ORAL
  Filled 2021-09-23: qty 1

## 2021-09-23 MED ORDER — ALBUTEROL SULFATE HFA 108 (90 BASE) MCG/ACT IN AERS: 2.0000 | INHALATION_SPRAY | RESPIRATORY_TRACT | Status: DC | PRN

## 2021-09-23 MED ORDER — GUAIFENESIN 100 MG/5ML PO SOLN
15.0000 mL | Freq: Once | ORAL | Status: AC
  Administered 2021-09-23: 300 mg via ORAL
  Filled 2021-09-23: qty 20

## 2021-09-23 MED ORDER — PREDNISONE 10 MG PO TABS
60.0000 mg | ORAL_TABLET | Freq: Once | ORAL | Status: AC
  Administered 2021-09-23: 60 mg via ORAL
  Filled 2021-09-23: qty 1

## 2021-09-23 NOTE — ED Triage Notes (Signed)
Patient reports to the ER for being COVID+. Patient states she first started having symptoms on September 30. Patient states she has not been taking the antiviral medications as her provider was hesitant to prescribe and stated "to take them at her own risk" Patient reports she is allergic to the COVID vaccine so only got an initial shot.

## 2021-09-23 NOTE — ED Notes (Signed)
Pt discharged home after verbalizing understanding of discharge instructions; nad noted. 

## 2021-09-23 NOTE — ED Notes (Signed)
Pt given ice chips; ok per Dr. Denton Lank

## 2021-09-23 NOTE — ED Provider Notes (Signed)
[facility] EMERGENCY DEPT Provider Note   CSN: [id number] Arrival date & time: 09/23/21  1321     History Chief Complaint  Patient presents with   Covid Positive    Danielle May is a 45 y.o. female.  Patient c/o covid dx a week ago, with symptoms beginning 10 days ago. Symptoms acute onset, moderate, persistent, w non prod cough, body aches, intermittent nausea. No bloody or bilious emesis. Having normal bms. No severe diarrhea. No sore throat or trouble swallowing. No sob. +body aches. + fever Did previously have single covid vaccine. Saw doctor for covid test one week ago - was given rx paxlovid but did not take rx. Pt does have hx asthma, notes wheezing. W asthma, no prior admission or icu stay.  Pt denies leg pain or swelling. No hx dvt or pe. Non smoker.   The history is provided by the patient.      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 No pertinent surgical history.   OB History     Gravida  0   Para  0   Term  0   Preterm  0   AB  0   Living  0      SAB  0   IAB  0   Ectopic  0   Multiple  0   Live Births  0           No family history on file.  Social History   Tobacco Use   Smoking status: Never    Passive exposure: Never   Smokeless tobacco: Never  Vaping Use   Vaping Use: Never used  Substance Use Topics   Alcohol use: No   Drug use: No    Home Medications Prior to Admission medications   Medication Sig Start Date End Date Taking? Authorizing Provider  albuterol (PROVENTIL) (2.5 MG/3ML) 0.083% nebulizer solution Inhale 2.5 mg into the lungs every 6 (six) hours as needed for wheezing or shortness of breath.     [provider]   Albuterol Sulfate 108 (90 Base) MCG/ACT AEPB Inhale 2 puffs into the lungs every 6 (six) hours as needed (for wheezing or shortness of breath).     [provider]  Artificial Tear Solution (GENTEAL TEARS OP) Apply 1 drop to eye daily as needed (dry eye relief).    [provider]  cetirizine (ZYRTEC) 10 MG tablet Take 10 mg by mouth daily.    [provider]  cholecalciferol (VITAMIN D) 1000 units tablet Take 1,000 Units by mouth daily.    [provider]  fluticasone (FLONASE) 50 MCG/ACT nasal spray Place 1 spray into both nostrils daily.    [provider]  Fluticasone-Salmeterol (ADVAIR) 250-50 MCG/DOSE AEPB Inhale 1 puff into the lungs 2 (two) times daily.    [provider]  montelukast (SINGULAIR) 10 MG tablet Take 10 mg by mouth at bedtime.    [provider]  ranitidine (ZANTAC) 150 MG tablet Take 150 mg by mouth 2 (two) times daily as needed for heartburn.    [provider]  vitamin C (ASCORBIC ACID) 500 MG tablet Take 500 mg by mouth  daily.    [provider]    Allergies    Cyclobenzaprine hcl, Ipratropium, Venlafaxine, Codeine, Hydrocodone, Hydrocodone-acetaminophen, Ortho tri-cyclen  [norgestimate-eth estradiol], Oxycodone-acetaminophen, Propoxyphene n-acetaminophen, and Tramadol hcl  Review of Systems   Review of Systems  Constitutional:  Positive for fever. Negative for chills.  HENT:  Negative for sore throat.   Eyes:  Negative for redness.  Respiratory:  Positive for cough and wheezing.   Cardiovascular:  Negative for chest pain.  Gastrointestinal:  Positive for nausea and vomiting. Negative for abdominal pain.  Genitourinary:  Negative for dysuria and flank pain.  Musculoskeletal:  Negative for back pain and neck pain.  Skin:  Negative for rash.  Neurological:  Negative for headaches.  Hematological:  Does not bruise/bleed easily.  Psychiatric/Behavioral:  Negative for confusion.     Physical Exam Updated Vital Signs BP (!) 115/91 (BP Location: Right Arm)   Pulse 96   Temp 98.9 F (37.2 C) (Oral)   Resp (!) 27   Ht 1.6 m (5\' 3" )   Wt 113.4 kg   LMP 09/03/2021 (Exact Date)   SpO2 99%   BMI 44.29 kg/m   Physical Exam Vitals and nursing note reviewed.  Constitutional:      Appearance: Normal appearance. She is well-developed.  HENT:     Head: Atraumatic.     Nose: Nose normal.     Mouth/Throat:     Mouth: Mucous membranes are moist.     Pharynx: Oropharynx is clear.  Eyes:     General: No scleral icterus.    Conjunctiva/sclera: Conjunctivae normal.  Neck:     Trachea: No tracheal deviation.     Comments: No stiffness or rigidity.  Cardiovascular:     Rate and Rhythm: Normal rate and regular rhythm.     Pulses: Normal pulses.     Heart sounds: Normal heart sounds. No murmur heard.   No friction rub. No gallop.  Pulmonary:     Effort: Pulmonary effort is normal. No respiratory distress.     Breath sounds: Wheezing present.  Abdominal:     General: Bowel sounds are normal. There is no distension.     Palpations: Abdomen is soft.     Tenderness: There is no abdominal tenderness.  Genitourinary:    Comments: No cva tenderness.  Musculoskeletal:        General: No swelling or tenderness.     Cervical back: Normal range of motion and neck supple. No rigidity. No muscular tenderness.     Right lower leg: No edema.     Left lower leg: No edema.  Skin:    General: Skin is warm and dry.     Findings: No rash.  Neurological:     Mental Status: She is alert.     Comments: Alert, speech normal.   Psychiatric:        Mood and Affect: Mood normal.    ED Results / Procedures / Treatments   Labs (all labs ordered are listed, but only abnormal results are displayed) Labs Reviewed - No data to display  EKG None  Radiology No results found.  Procedures Procedures   Medications Ordered in ED Medications  albuterol (VENTOLIN HFA) 108 (90 Base)  MCG/ACT inhaler 2 puff (2 puffs Inhalation Given 09/23/21 1357)  ondansetron (ZOFRAN-ODT) disintegrating tablet 8 mg (has no administration in time range)  guaiFENesin (ROBITUSSIN) 100 MG/5ML solution 300 mg (has no administration in time range)    ED Course  I have reviewed the triage  vital signs and the nursing notes.  Pertinent labs & imaging results that were available during my care of the patient were reviewed by me and considered in my medical decision making (see chart for details).    MDM Rules/Calculators/A&P                          Reviewed nursing notes and prior charts for additional history.   Outpatient xray from today reviewed/interpreted by me - no pna.   Pt with hx asthma, wheezing. Albuterol tx. Prednisone po.   Robitussin po. Pt requests reglan for nausea - provided.  Po fluids.  2nd albuterol tx.   Recheck wheezing improved.   Recheck, breathing comfortably. No wheezing. No vomiting.   Pt currently appears stable for d/c. Pulse ox 99% room air.   Return precautions provided.      Final Clinical Impression(s) / ED Diagnoses Final diagnoses:  None    Rx / DC Orders ED Discharge Orders     None        Cathren Laine, MD 09/23/21 [phone]

## 2021-09-23 NOTE — Discharge Instructions (Addendum)
It was our pleasure to provide your ER care today - we hope that you feel better.  For asthma, take prednisone as prescribed. Use albuterol treatment every 4 hrs as need.   Stay active, take full and deep breaths. Stay well hydrated/drink adequate fluids.   May try over the counter cough medication as need.   Return to ER if worse, new symptoms, increased difficulty breathing, persistent vomiting, or other concern.

## 2021-09-23 NOTE — ED Notes (Signed)
Dr Denton Lank at bedside

## 2021-09-23 NOTE — ED Notes (Signed)
Patient is resting comfortably, warm blankets given.  

## 2021-09-23 NOTE — ED Notes (Signed)
Pt from home on day 10 of covid. Pt has been on antivirals. Pt productive cough during assessment; c/o nausea and emesis. Pt alert & oriented, nad noted.

## 2023-02-24 IMAGING — DX DG CHEST 1V PORT
1 series · 1 of 1 positions shown · non-contrast
Comparison: November 15, 2010.

CLINICAL DATA: Cough, shortness of breath.

EXAM:
PORTABLE CHEST 1 VIEW

[chest]
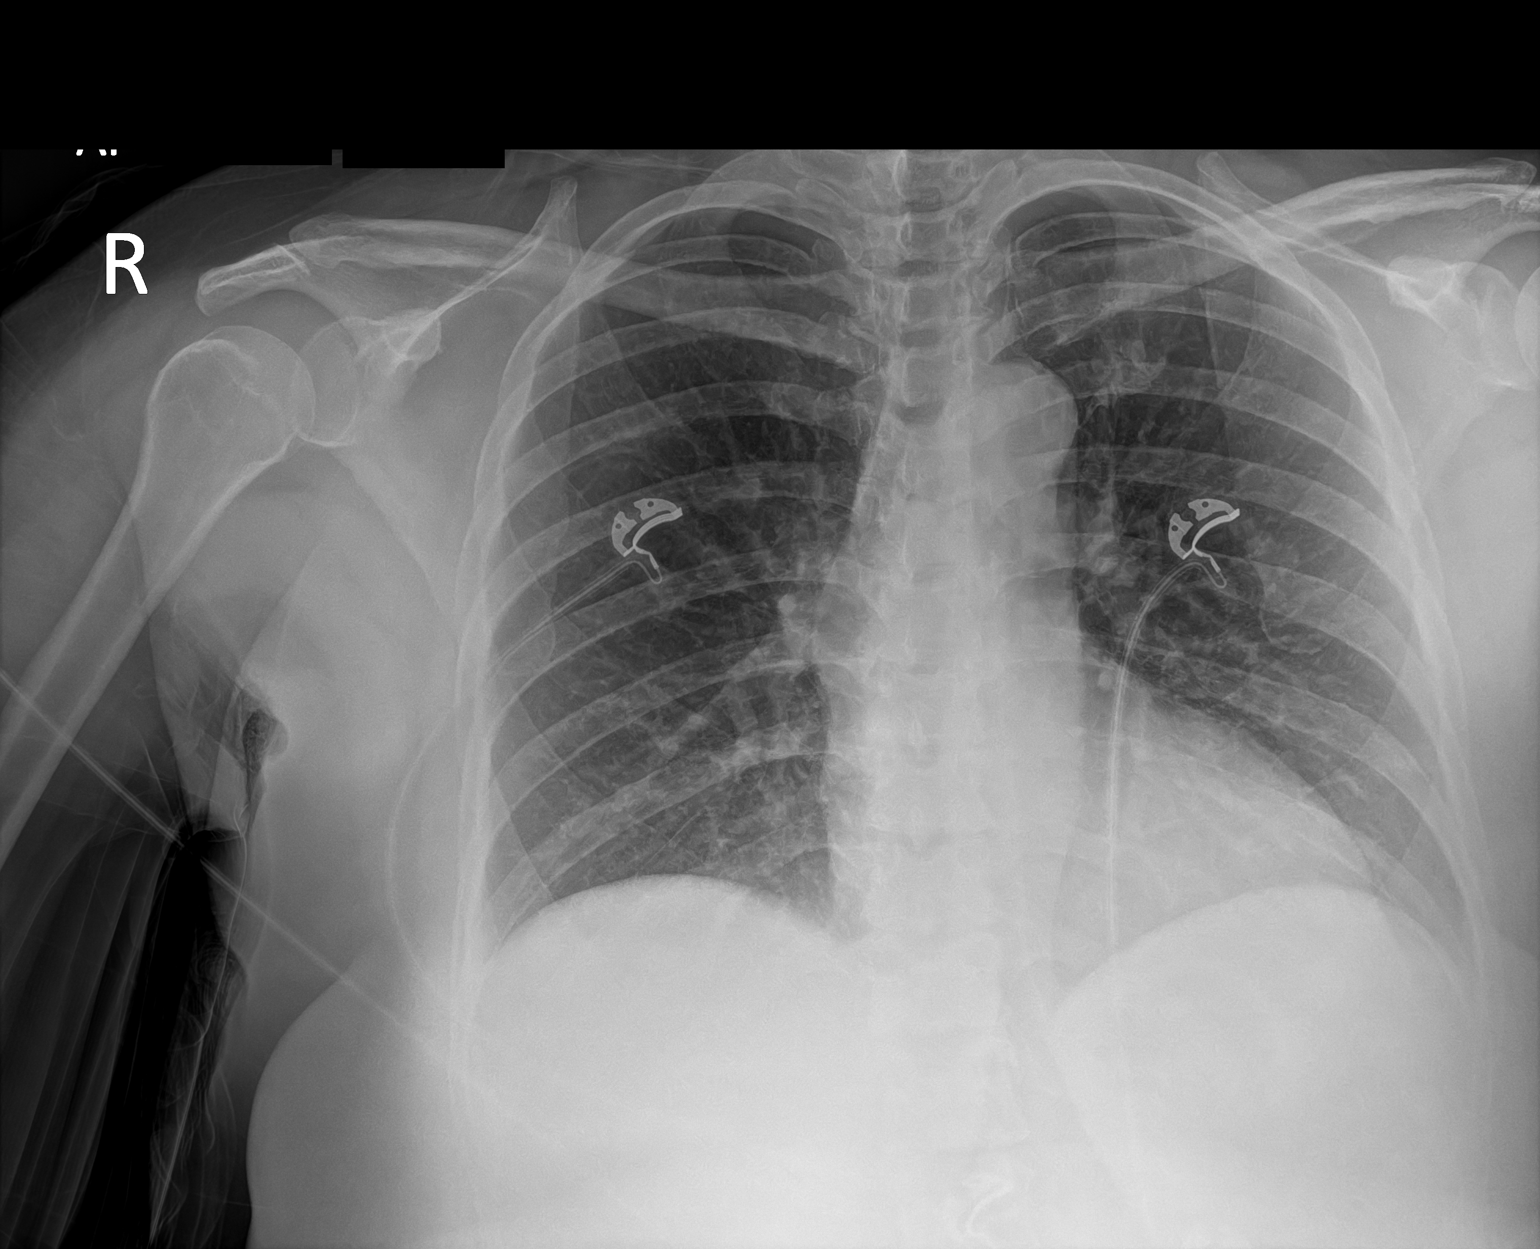

[1 of 1 positions shown; findings below may reference images not displayed]

FINDINGS: The heart size and mediastinal contours are within normal limits.
Both lungs are clear. The visualized skeletal structures are
unremarkable.
IMPRESSION: No active disease.
